# Patient Record
Sex: Male | Born: 1985 | Race: White | Hispanic: No | Marital: Single | Smoking: Light tobacco smoker
Health system: Southern US, Community
[De-identification: ages and names within clinical notes are randomized; demographics above are authoritative.]

---

## 2016-09-20 ENCOUNTER — Ambulatory Visit (INDEPENDENT_AMBULATORY_CARE_PROVIDER_SITE_OTHER): Payer: Worker's Compensation | Admitting: Physician Assistant

## 2016-09-20 ENCOUNTER — Ambulatory Visit (INDEPENDENT_AMBULATORY_CARE_PROVIDER_SITE_OTHER): Payer: Worker's Compensation

## 2016-09-20 ENCOUNTER — Other Ambulatory Visit: Payer: Self-pay | Admitting: Physician Assistant

## 2016-09-20 ENCOUNTER — Encounter: Payer: Self-pay | Admitting: Physician Assistant

## 2016-09-20 VITALS — BP 97/64 | HR 77 | Temp 98.6°F | Ht 67.0 in | Wt 149.8 lb

## 2016-09-20 DIAGNOSIS — S39012A Strain of muscle, fascia and tendon of lower back, initial encounter: Secondary | ICD-10-CM

## 2016-09-20 MED ORDER — IBUPROFEN 800 MG PO TABS: 800.0000 mg | ORAL_TABLET | Freq: Three times a day (TID) | ORAL | 0 refills | Status: DC | PRN

## 2016-09-20 MED ORDER — METHYLPREDNISOLONE ACETATE 80 MG/ML IJ SUSP
80.0000 mg | Freq: Once | INTRAMUSCULAR | Status: AC
  Administered 2016-09-20: 80 mg via INTRAMUSCULAR

## 2016-09-20 MED ORDER — CYCLOBENZAPRINE HCL 10 MG PO TABS: 10.0000 mg | ORAL_TABLET | Freq: Three times a day (TID) | ORAL | 0 refills | Status: AC | PRN

## 2016-09-20 NOTE — Patient Instructions (Signed)
Low Back Sprain  A sprain is a stretch or tear in the bands of tissue that hold bones and joints together (ligaments). Sprains of the lower back (lumbar spine) are a common cause of low back pain. A sprain occurs when ligaments are overextended or stretched beyond their limits. The ligaments can become inflamed, resulting in pain and sudden muscle tightening (spasms). A sprain can be caused by an injury (trauma), or it can develop gradually due to overuse.  There are three types of sprains:  · Grade 1 is a mild sprain involving an overstretched ligament or a very slight tear of the ligament.  · Grade 2 is a moderate sprain involving a partial tear of the ligament.  · Grade 3 is a severe sprain involving a complete tear of the ligament.    What are the causes?  This condition may be caused by:  · Trauma, such as a fall or a hit to the body.  · Twisting or overstretching the back. This may result from doing activities that require a lot of energy, such as lifting heavy objects.    What increases the risk?  The following factors may increase your risk of getting this condition:  · Playing contact sports.  · Participating in sports or activities that put excessive stress on the back and require a lot of bending and twisting, including:  ? Lifting weights or heavy objects.  ? Gymnastics.  ? Soccer.  ? Figure skating.  ? Snowboarding.  · Being overweight or obese.  · Having poor strength and flexibility.    What are the signs or symptoms?  Symptoms of this condition may include:  · Sharp or dull pain in the lower back that does not go away. Pain may extend to the buttocks.  · Stiffness.  · Limited range of motion.  · Inability to stand up straight due to stiffness or pain.  · Muscle spasms.    How is this diagnosed?    This condition may be diagnosed based on:  · Your symptoms.  · Your medical history.  · A physical exam.  ? Your health care provider may push on certain areas of your back to determine the source of your  pain.  ? You may be asked to bend forward, backward, and side to side to assess the severity of your pain and your range of motion.  · Imaging tests, such as:  ? X-rays.  ? MRI.    How is this treated?  Treatment for this condition may include:  · Applying heat and cold to the affected area.  · Medicines to help relieve pain and to relax your muscles (muscle relaxants).  · NSAIDs to help reduce swelling and discomfort.  · Physical therapy.    When your symptoms improve, it is important to gradually return to your normal routine as soon as possible to reduce pain, avoid stiffness, and avoid loss of muscle strength. Generally, symptoms should improve within 6 weeks of treatment. However, recovery time varies.  Follow these instructions at home:  Managing pain, stiffness, and swelling  · If directed, apply ice to the injured area during the first 24 hours after your injury.  ? Put ice in a plastic bag.  ? Place a towel between your skin and the bag.  ? Leave the ice on for 20 minutes, 2-3 times a day.  · If directed, apply heat to the affected area as often as told by your health care provider. Use the   heat source that your health care provider recommends, such as a moist heat pack or a heating pad.  ? Place a towel between your skin and the heat source.  ? Leave the heat on for 20-30 minutes.  ? Remove the heat if your skin turns bright red. This is especially important if you are unable to feel pain, heat, or cold. You may have a greater risk of getting burned.  Activity  · Rest and return to your normal activities as told by your health care provider. Ask your health care provider what activities are safe for you.  · Avoid activities that take a lot of effort (are strenuous) for as long as told by your health care provider.  · Do exercises as told by your health care provider.  General instructions    · Take over-the-counter and prescription medicines only as told by your health care provider.  · If you have  questions or concerns about safety while taking pain medicine, talk with your health care provider.  · Do not drive or operate heavy machinery until you know how your pain medicine affects you.  · Do not use any tobacco products, such as cigarettes, chewing tobacco, and e-cigarettes. Tobacco can delay bone healing. If you need help quitting, ask your health care provider.  · Keep all follow-up visits as told by your health care provider. This is important.  How is this prevented?  · Warm up and stretch before being active.  · Cool down and stretch after being active.  · Give your body time to rest between periods of activity.  · Avoid:  ? Being physically inactive for long periods at a time.  ? Exercising or playing sports when you are tired or in pain.  · Use correct form when playing sports and lifting heavy objects.  · Use good posture when sitting and standing.  · Maintain a healthy weight.  · Sleep on a mattress with medium firmness to support your back.  · Make sure to use equipment that fits you, including shoes that fit well.  · Be safe and responsible while being active to avoid falls.  · Do at least 150 minutes of moderate-intensity exercise each week, such as brisk walking or water aerobics. Try a form of exercise that takes stress off your back, such as swimming or stationary cycling.  · Maintain physical fitness, including:  ? Strength. In particular, develop and maintain strong abdominal muscles.  ? Flexibility.  ? Cardiovascular fitness.  ? Endurance.  Contact a health care provider if:  · Your back pain does not improve after 6 weeks of treatment.  · Your symptoms get worse.  Get help right away if:  · Your back pain is severe.  · You are unable to stand or walk.  · You develop pain in your legs.  · You develop weakness in your buttocks or legs.  · You have difficulty controlling when you urinate or when you have a bowel movement.  This information is not intended to replace advice given to you by  your health care provider. Make sure you discuss any questions you have with your health care provider.  Document Released: 02/26/2005 Document Revised: 11/03/2015 Document Reviewed: 12/08/2014  Elsevier Interactive Patient Education © 2018 [employer]

## 2016-09-21 NOTE — Progress Notes (Signed)
BP 97/64   Pulse 77   Temp 98.6 F (37 C) (Oral)   Ht 5\' 7"  (1.702 m)   Wt 149 lb 12.8 oz (67.9 kg)   BMI 23.46 kg/m    Subjective:    Patient ID: Cristian BlackbirdAustin Wayne Anton, male    DOB: Sep 06, 1985, 31 y.o.   MRN: [id number]  HPI: Cristian Barnes is a 31 y.o. male presenting on 09/20/2016 for Back Pain [facility] DOI 09/07/16 LBP, lifted part weighing 50-60 lbs to put back on machine twisted & back started hurting, went to Urgent Care initially)  Patient originally injured back on 09/07/2016. He was lifting something weight about 50 pounds at work. He twisted and felt the pain immediately. He did not express any leg weakness. He has not had gait instability. He states the pain is primarily in the lumbar area and goes down into the left sacrum. He does not have any radiating pain in the sciatic distribution. He has had a continued to work and probably lifting more than he needed to.  Relevant past medical, surgical, family and social history reviewed and updated as indicated. Allergies and medications reviewed and updated.  History reviewed. No pertinent past medical history.  History reviewed. No pertinent surgical history.  Review of Systems  Constitutional: Negative.  Negative for appetite change and fatigue.  HENT: Negative.   Eyes: Negative.  Negative for pain and visual disturbance.  Respiratory: Negative.  Negative for cough, chest tightness, shortness of breath and wheezing.   Cardiovascular: Negative.  Negative for chest pain, palpitations and leg swelling.  Gastrointestinal: Negative.  Negative for abdominal pain, diarrhea, nausea and vomiting.  Endocrine: Negative.   Genitourinary: Negative.   Musculoskeletal: Positive for back pain and myalgias. Negative for gait problem.  Skin: Negative.  Negative for color change and rash.  Neurological: Negative.  Negative for weakness, numbness and headaches.  Psychiatric/Behavioral: Negative.     Allergies as of 09/20/2016     No Known Allergies     Medication List       Accurate as of 09/20/16 11:59 PM. Always use your most recent med list.          cyclobenzaprine 10 MG tablet Commonly known as:  FLEXERIL Take 1 tablet (10 mg total) by mouth 3 (three) times daily as needed for muscle spasms.   ibuprofen 800 MG tablet Commonly known as:  ADVIL,MOTRIN Take 1 tablet (800 mg total) by mouth every 8 (eight) hours as needed.          Objective:    BP 97/64   Pulse 77   Temp 98.6 F (37 C) (Oral)   Ht 5\' 7"  (1.702 m)   Wt 149 lb 12.8 oz (67.9 kg)   BMI 23.46 kg/m   No Known Allergies  Physical Exam  Constitutional: He appears well-developed and well-nourished. No distress.  HENT:  Head: Normocephalic and atraumatic.  Eyes: Pupils are equal, round, and reactive to light. Conjunctivae and EOM are normal.  Cardiovascular: Normal rate, regular rhythm and normal heart sounds.   Pulmonary/Chest: Effort normal and breath sounds normal. No respiratory distress.  Musculoskeletal:       Lumbar back: He exhibits decreased range of motion, tenderness, pain and spasm.       Back:  Neurological: He has normal strength.  Reflex Scores:      Patellar reflexes are 1+ on the right side and 1+ on the left side. Skin: Skin is warm and dry.  Psychiatric: He has  a normal mood and affect. His behavior is normal.  Nursing note and vitals reviewed.   No results found for this or any previous visit.    Assessment & Plan:   1. Strain of lumbar region, initial encounter - methylPREDNISolone acetate (DEPO-MEDROL) injection 80 mg; Inject 1 mL (80 mg total) into the muscle once. - ibuprofen (ADVIL,MOTRIN) 800 MG tablet; Take 1 tablet (800 mg total) by mouth every 8 (eight) hours as needed.  Dispense: 30 tablet; Refill: 0 - cyclobenzaprine (FLEXERIL) 10 MG tablet; Take 1 tablet (10 mg total) by mouth 3 (three) times daily as needed for muscle spasms.  Dispense: 90 tablet; Refill: 0 XRAY normal  Continue all  other maintenance medications as listed above.  Follow up plan: Return in about 5 days (around 09/25/2016) for recheck.  Educational handout given for back strain  Remus Loffler PA-C [facility] [address]  [place], [place] [address] [phone]   09/21/2016, 8:10 AM

## 2016-09-25 ENCOUNTER — Encounter: Payer: Self-pay | Admitting: Physician Assistant

## 2016-09-25 ENCOUNTER — Other Ambulatory Visit: Payer: Self-pay | Admitting: *Deleted

## 2016-09-25 ENCOUNTER — Ambulatory Visit (INDEPENDENT_AMBULATORY_CARE_PROVIDER_SITE_OTHER): Payer: Worker's Compensation | Admitting: Physician Assistant

## 2016-09-25 VITALS — BP 114/68 | HR 100 | Temp 98.9°F | Ht 67.0 in | Wt 149.8 lb

## 2016-09-25 DIAGNOSIS — S39012D Strain of muscle, fascia and tendon of lower back, subsequent encounter: Secondary | ICD-10-CM

## 2016-09-25 DIAGNOSIS — S39012S Strain of muscle, fascia and tendon of lower back, sequela: Secondary | ICD-10-CM

## 2016-09-25 DIAGNOSIS — S39012A Strain of muscle, fascia and tendon of lower back, initial encounter: Secondary | ICD-10-CM | POA: Insufficient documentation

## 2016-09-25 MED ORDER — PREDNISONE 10 MG (48) PO TBPK: ORAL_TABLET | ORAL | 0 refills | Status: AC

## 2016-09-25 NOTE — Patient Instructions (Signed)
In a few days you may receive a survey in the mail or online from [employer] regarding your visit with us today. Please take a moment to fill this out. Your feedback is very important to our whole office. It can help us better understand your needs as well as improve your experience and satisfaction. Thank you for taking your time to complete it. We care about you.  Jenel Gierke, PA-C  

## 2016-09-25 NOTE — Progress Notes (Signed)
BP 114/68   Pulse 100   Temp 98.9 F (37.2 C) (Oral)   Ht 5\' 7"  (1.702 m)   Wt 149 lb 12.8 oz (67.9 kg)   BMI 23.46 kg/m    Subjective:    Patient ID: Cristian Barnes, male    DOB: 01-15-1986, 31 y.o.   MRN: [id number]  HPI: Cristian Barnes is a 31 y.o. male presenting on 09/25/2016 for Worker Comp follow up (Back pain. Injury date 09/07/16.)  He comes in for recheck on lumbosacral strain, injured 09/07/16. Had continued to work and the pain worsened. Has been out 5 days with mild improvement. Reports he can sleep at night but as soon as he gets back up there is sharp pain in the lower lumbar and sacral areas with slight radiation to the left gluteus. Denies leg weakness or falls. Continue no work and plan PT soon. Patient lives in [place].  Relevant past medical, surgical, family and social history reviewed and updated as indicated. Allergies and medications reviewed and updated.  History reviewed. No pertinent past medical history.  History reviewed. No pertinent surgical history.  Review of Systems  Constitutional: Negative.  Negative for appetite change and fatigue.  Eyes: Negative for pain and visual disturbance.  Respiratory: Negative.  Negative for cough, chest tightness, shortness of breath and wheezing.   Cardiovascular: Negative.  Negative for chest pain, palpitations and leg swelling.  Gastrointestinal: Negative.  Negative for abdominal pain, diarrhea, nausea and vomiting.  Genitourinary: Negative.   Musculoskeletal: Positive for back pain, gait problem and myalgias.  Skin: Negative.  Negative for color change and rash.  Neurological: Negative for weakness, numbness and headaches.  Psychiatric/Behavioral: Negative.     Allergies as of 09/25/2016   No Known Allergies     Medication List       Accurate as of 09/25/16 10:17 AM. Always use your most recent med list.          cyclobenzaprine 10 MG tablet Commonly known as:  FLEXERIL Take 1  tablet (10 mg total) by mouth 3 (three) times daily as needed for muscle spasms.   predniSONE 10 MG (48) Tbpk tablet Commonly known as:  STERAPRED UNI-PAK 48 TAB Take as directed 12 days          Objective:    BP 114/68   Pulse 100   Temp 98.9 F (37.2 C) (Oral)   Ht 5\' 7"  (1.702 m)   Wt 149 lb 12.8 oz (67.9 kg)   BMI 23.46 kg/m   No Known Allergies  Physical Exam  Constitutional: He appears well-developed and well-nourished. No distress.  HENT:  Head: Normocephalic and atraumatic.  Eyes: Pupils are equal, round, and reactive to light. Conjunctivae and EOM are normal.  Cardiovascular: Normal rate, regular rhythm and normal heart sounds.   Pulmonary/Chest: Effort normal and breath sounds normal. No respiratory distress.  Musculoskeletal:       Lumbar back: He exhibits decreased range of motion, tenderness, pain and spasm.       Back:  Skin: Skin is warm and dry.  Psychiatric: He has a normal mood and affect. His behavior is normal.  Nursing note and vitals reviewed.   No results found for this or any previous visit.    Assessment & Plan:   1. Strain of lumbar region, subsequent encounter Out through 10/05/16 Order PT for lumbar/sacral strain Injury date 09/07/16  2. Strain, sacral, sequela Out through 10/05/16 Order PT for lumbar/sacral strain Injury date 09/07/16  Current Outpatient Prescriptions:  .  cyclobenzaprine (FLEXERIL) 10 MG tablet, Take 1 tablet (10 mg total) by mouth 3 (three) times daily as needed for muscle spasms., Disp: 90 tablet, Rfl: 0 .  predniSONE (STERAPRED UNI-PAK 48 TAB) 10 MG (48) TBPK tablet, Take as directed 12 days, Disp: 48 tablet, Rfl: 0  Continue all other maintenance medications as listed above.  Follow up plan: Return in about 10 days (around 10/05/2016) for recheck.  Educational handout given for survey  Cristian LofflerAngel S. Chevie Birkhead PA-C [facility] [address]  [place], [place]  [id number] [phone]   09/25/2016, 10:17 AM

## 2016-09-26 ENCOUNTER — Telehealth: Payer: Self-pay | Admitting: Physician Assistant

## 2016-09-26 ENCOUNTER — Encounter: Payer: Self-pay | Admitting: *Deleted

## 2016-09-26 NOTE — Telephone Encounter (Signed)
Patient states he already has letter.

## 2016-09-26 NOTE — Telephone Encounter (Signed)
Yes that is right

## 2016-10-05 ENCOUNTER — Ambulatory Visit: Payer: Worker's Compensation | Admitting: Physician Assistant

## 2018-02-28 IMAGING — DX DG LUMBAR SPINE 2-3V
2 series · 2 of 2 positions shown · non-contrast
Comparison: None.

CLINICAL DATA: 30-year-old with a twisting injury to the low back.
Lumbar strain. Initial encounter.

EXAM:
LUMBAR SPINE - 2-3 VIEW

[l-spine ap]
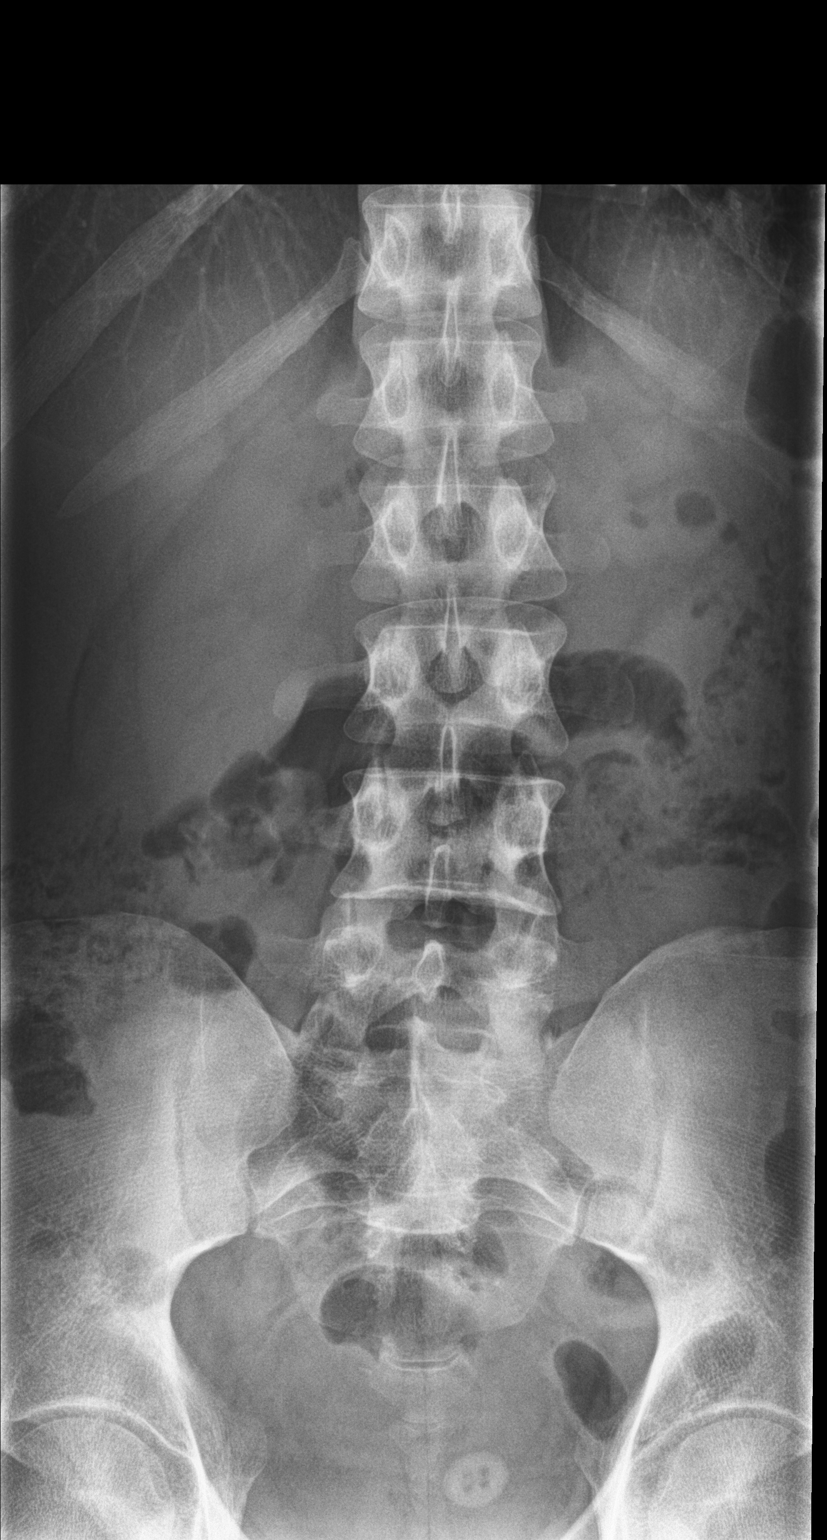

[l-spine lat]
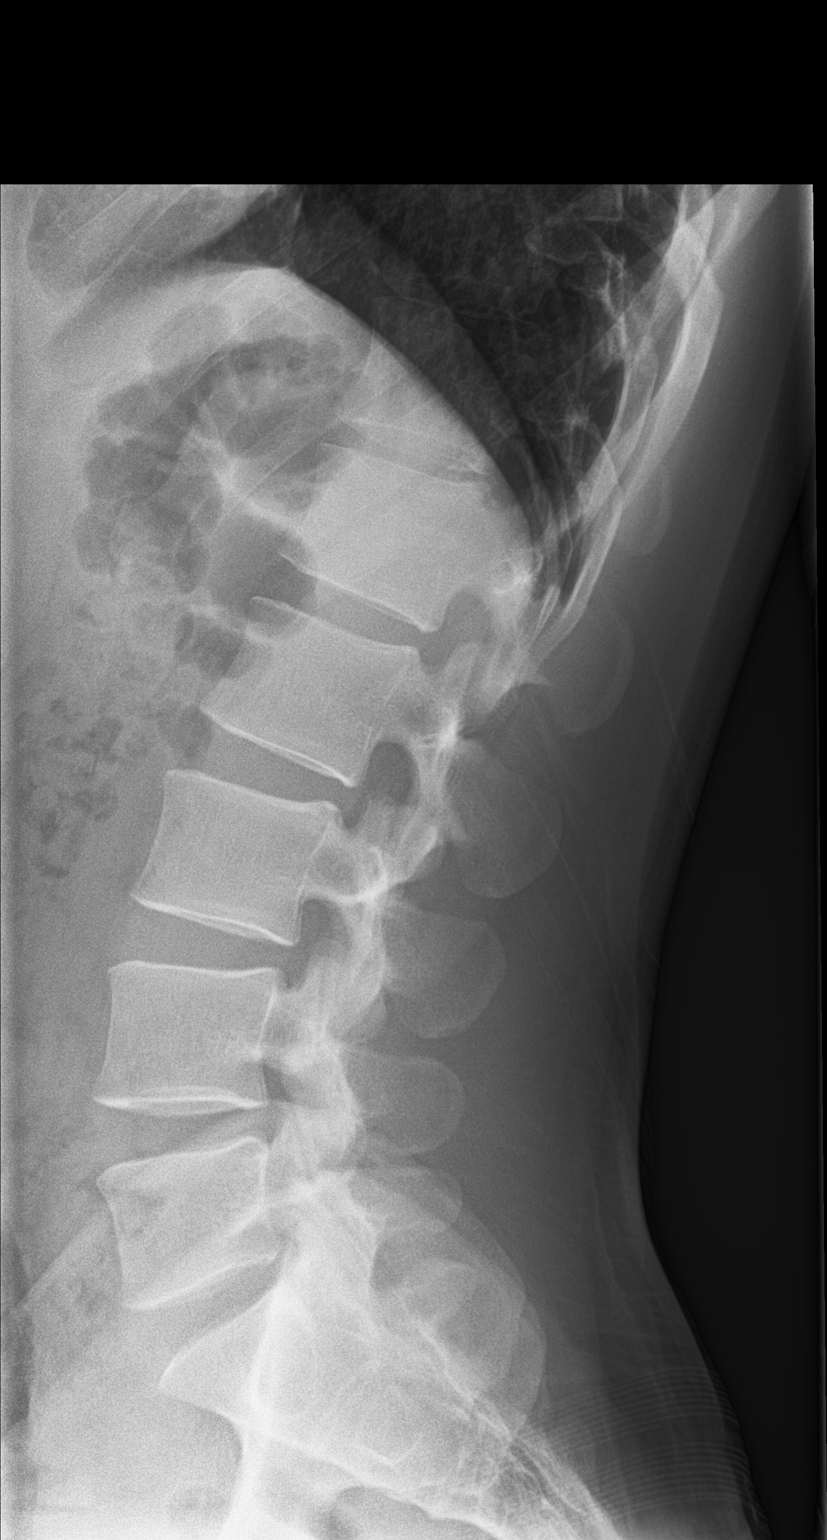

[2 of 2 positions shown; findings below may reference images not displayed]

FINDINGS: Five non rib-bearing lumbar vertebrae with anatomic alignment. No
fractures involving the lumbar spine. Well preserved disc spaces. No
visible posterior element hypertrophy. Visualized sacroiliac joints
intact.
IMPRESSION: Normal examination.

## 2020-04-12 DEATH — deceased
